# Patient Record
Sex: Female | Born: 1985 | Race: Black or African American | Hispanic: No | Marital: Single | Smoking: Never smoker
Health system: Southern US, Community
[De-identification: ages and names within clinical notes are randomized; demographics above are authoritative.]

## PROBLEM LIST (undated history)

## (undated) DIAGNOSIS — E079 Disorder of thyroid, unspecified: Secondary | ICD-10-CM

## (undated) DIAGNOSIS — I10 Essential (primary) hypertension: Secondary | ICD-10-CM

## (undated) DIAGNOSIS — N12 Tubulo-interstitial nephritis, not specified as acute or chronic: Secondary | ICD-10-CM

## (undated) DIAGNOSIS — D649 Anemia, unspecified: Secondary | ICD-10-CM

## (undated) HISTORY — DX: Tubulo-interstitial nephritis, not specified as acute or chronic: N12

## (undated) HISTORY — DX: Anemia, unspecified: D64.9

## (undated) HISTORY — PX: OTHER SURGICAL HISTORY: SHX169

---

## 2008-05-05 ENCOUNTER — Emergency Department: Payer: Self-pay | Admitting: Emergency Medicine

## 2008-10-07 ENCOUNTER — Encounter: Payer: Self-pay | Admitting: Maternal & Fetal Medicine

## 2009-03-11 ENCOUNTER — Observation Stay: Payer: Self-pay | Admitting: Unknown Physician Specialty

## 2009-03-14 ENCOUNTER — Inpatient Hospital Stay: Payer: Self-pay

## 2011-03-18 ENCOUNTER — Observation Stay: Payer: Self-pay | Admitting: Internal Medicine

## 2011-03-23 ENCOUNTER — Observation Stay: Payer: Self-pay

## 2011-04-01 ENCOUNTER — Observation Stay: Payer: Self-pay

## 2011-04-02 ENCOUNTER — Inpatient Hospital Stay: Payer: Self-pay

## 2014-10-19 ENCOUNTER — Encounter (HOSPITAL_COMMUNITY): Payer: Self-pay | Admitting: Emergency Medicine

## 2014-10-19 ENCOUNTER — Emergency Department (HOSPITAL_COMMUNITY)
Admission: EM | Admit: 2014-10-19 | Discharge: 2014-10-20 | Disposition: A | Discharge status: Home / Self Care | Payer: Self-pay | Attending: Emergency Medicine | Admitting: Emergency Medicine

## 2014-10-19 DIAGNOSIS — E079 Disorder of thyroid, unspecified: Secondary | ICD-10-CM | POA: Insufficient documentation

## 2014-10-19 DIAGNOSIS — I10 Essential (primary) hypertension: Secondary | ICD-10-CM | POA: Insufficient documentation

## 2014-10-19 DIAGNOSIS — R000 Tachycardia, unspecified: Secondary | ICD-10-CM | POA: Insufficient documentation

## 2014-10-19 DIAGNOSIS — R0602 Shortness of breath: Secondary | ICD-10-CM | POA: Insufficient documentation

## 2014-10-19 DIAGNOSIS — R55 Syncope and collapse: Secondary | ICD-10-CM | POA: Insufficient documentation

## 2014-10-19 DIAGNOSIS — Z79899 Other long term (current) drug therapy: Secondary | ICD-10-CM | POA: Insufficient documentation

## 2014-10-19 DIAGNOSIS — R0789 Other chest pain: Secondary | ICD-10-CM | POA: Insufficient documentation

## 2014-10-19 HISTORY — DX: Essential (primary) hypertension: I10

## 2014-10-19 HISTORY — DX: Disorder of thyroid, unspecified: E07.9

## 2014-10-19 LAB — CBC WITH DIFFERENTIAL/PLATELET
Basophils Absolute: 0 10*3/uL (ref 0.0–0.1)
Basophils Relative: 0 % (ref 0–1)
Eosinophils Absolute: 0 10*3/uL (ref 0.0–0.7)
Eosinophils Relative: 0 % (ref 0–5)
HEMATOCRIT: 39.7 % (ref 36.0–46.0)
Hemoglobin: 13.4 g/dL (ref 12.0–15.0)
Lymphocytes Relative: 51 % — ABNORMAL HIGH (ref 12–46)
Lymphs Abs: 3.5 10*3/uL (ref 0.7–4.0)
MCH: 28.9 pg (ref 26.0–34.0)
MCHC: 33.8 g/dL (ref 30.0–36.0)
MCV: 85.6 fL (ref 78.0–100.0)
Monocytes Absolute: 0.4 10*3/uL (ref 0.1–1.0)
Monocytes Relative: 6 % (ref 3–12)
NEUTROS ABS: 3 10*3/uL (ref 1.7–7.7)
Neutrophils Relative %: 43 % (ref 43–77)
Platelets: 276 10*3/uL (ref 150–400)
RBC: 4.64 MIL/uL (ref 3.87–5.11)
RDW: 13.4 % (ref 11.5–15.5)
WBC: 6.9 10*3/uL (ref 4.0–10.5)

## 2014-10-19 LAB — I-STAT TROPONIN, ED: Troponin i, poc: 0 ng/mL (ref 0.00–0.08)

## 2014-10-19 LAB — BASIC METABOLIC PANEL
ANION GAP: 7 (ref 5–15)
BUN: 9 mg/dL (ref 6–23)
CO2: 23 mmol/L (ref 19–32)
Calcium: 9 mg/dL (ref 8.4–10.5)
Chloride: 109 mmol/L (ref 96–112)
Creatinine, Ser: 0.7 mg/dL (ref 0.50–1.10)
GFR calc Af Amer: >90 mL/min (ref >90)
Glucose, Bld: 101 mg/dL — ABNORMAL HIGH (ref 70–99)
POTASSIUM: 3.6 mmol/L (ref 3.5–5.1)
SODIUM: 139 mmol/L (ref 135–145)

## 2014-10-19 MED ORDER — METOPROLOL TARTRATE 1 MG/ML IV SOLN
5.0000 mg | Freq: Once | INTRAVENOUS | Status: AC
  Administered 2014-10-19: 5 mg via INTRAVENOUS
  Filled 2014-10-19: qty 5

## 2014-10-19 MED ORDER — SODIUM CHLORIDE 0.9 % IV BOLUS (SEPSIS)
1000.0000 mL | Freq: Once | INTRAVENOUS | Status: AC
  Administered 2014-10-19: 1000 mL via INTRAVENOUS

## 2014-10-19 NOTE — ED Notes (Signed)
Pt arrives via EMS from home with c/o syncopal episode. Eye open, blinking, but unresponsive when found. HR 170s upon EMS arrival. NSR. Noncompliant with BP meds for a year. Chest tightness ongoing for months, feeling like her heart is racing ongoing for months. 20G LAC

## 2014-10-19 NOTE — ED Provider Notes (Signed)
CSN: [id number]     Arrival date & time 10/19/14  2238 History   First MD Initiated Contact with Patient 10/19/14 2258     Chief Complaint  Patient presents with  . Loss of Consciousness    Patient is a 29 y.o. female presenting with syncope. The history is provided by the patient. No language interpreter was used.  Loss of Consciousness Associated symptoms: shortness of breath   Associated symptoms: no diaphoresis, no fever, no nausea and no vomiting     This chart was scribed for Devoria Albe, MD by Andrew Au, ED Scribe. This patient was seen in room A11C/A11C and the patient's care was started at 4:57 AM.  HPI Comments:  Julia Woodard is a 29 y.o. female brought in by EMS who present to the Emergency Department complaining of LOC that occurred PTA. Pt reports the last she remembers before LOC is sitting on the couch when her vision became blurred. Pt states she has had worsening, intermittent chest tightness and SOB for the past couple of weeks. She reports chest tightness about twice a week that last for no longer than 15 minutes. She reports chest tightness improves with rest such as sitting. Nothing seems to make the pain start. Nothing makes the pain worse when she has it. Pt denies CP and trouble breathing at this time   She denies nausea, emesis, sweats, cough, fever, leg swelling. Pt reports hx of thyroid disease and is seen by Dr. Angelica Pou. Pt takes HCTZ prescribed by Dr. Dear and synthroid prescribed by Dr. Angelica Pou. Pt states she stop taking BP medication 6-7 months ago. Pt denies being a smoker. She reports having 2 glasses of wine tonight. Pt reports family hx of HTN, TIA's, stroke, congestive heart failure, COPD.   PCP Dr Dear Lauralee Evener OBG[facility] in [facility] Dr Shaune Pascal in [place]  Past Medical History  Diagnosis Date  . Hypertension   . Thyroid disease    History reviewed. No pertinent past surgical history. No family history on file. History  Substance Use  Topics  . Smoking status: Never Smoker   . Smokeless tobacco: Not on file  . Alcohol Use: Yes   OB History    No data available     Review of Systems  Constitutional: Negative for fever, chills and diaphoresis.  Respiratory: Positive for chest tightness and shortness of breath. Negative for cough.   Cardiovascular: Positive for syncope. Negative for leg swelling.  Gastrointestinal: Negative for nausea and vomiting.      Allergies  Review of patient's allergies indicates no known allergies.  Home Medications   Prior to Admission medications   Medication Sig Start Date End Date Taking? Authorizing Provider  levothyroxine (SYNTHROID, LEVOTHROID) 50 MCG tablet Take 50 mcg by mouth daily before breakfast.   Yes Historical Provider, MD   ED Triage Vitals  Enc Vitals Group     BP 10/19/14 2248 156/104 mmHg     Pulse Rate 10/19/14 2248 93     Resp 10/19/14 2248 16     Temp 10/19/14 2248 99.3 F (37.4 C)     Temp Source 10/19/14 2248 Oral     SpO2 10/19/14 2240 97 %     Weight --      Height --      Head Cir --      Peak Flow --      Pain Score 10/19/14 2243 0     Pain Loc --      Pain Edu? --  Excl. in GC? --    Vital signs normal except for hypertension   Physical Exam  Constitutional: She is oriented to person, place, and time. She appears well-developed and well-nourished.  Non-toxic appearance. She does not appear ill. No distress.  HENT:  Head: Normocephalic and atraumatic.  Right Ear: External ear normal.  Left Ear: External ear normal.  Nose: Nose normal. No mucosal edema or rhinorrhea.  Mouth/Throat: Oropharynx is clear and moist and mucous membranes are normal. No dental abscesses or uvula swelling.  Eyes: Conjunctivae and EOM are normal. Pupils are equal, round, and reactive to light.  Neck: Normal range of motion and full passive range of motion without pain. Neck supple.  Cardiovascular: Regular rhythm and normal heart sounds.  Tachycardia present.   Exam reveals no gallop and no friction rub.   No murmur heard. Pulmonary/Chest: Effort normal and breath sounds normal. No respiratory distress. She has no wheezes. She has no rhonchi. She has no rales. She exhibits no tenderness and no crepitus.  Abdominal: Soft. Normal appearance and bowel sounds are normal. She exhibits no distension. There is no tenderness. There is no rebound and no guarding.  Musculoskeletal: Normal range of motion. She exhibits no edema or tenderness.  Moves all extremities well.   Neurological: She is alert and oriented to person, place, and time. She has normal strength. No cranial nerve deficit.  Skin: Skin is warm, dry and intact. No rash noted. No erythema. No pallor.  Psychiatric: She has a normal mood and affect. Her speech is normal and behavior is normal. Her mood appears not anxious.  Nursing note and vitals reviewed.   ED Course  Procedures (including critical care time)  Medications  sodium chloride 0.9 % bolus 1,000 mL (0 mLs Intravenous Stopped 10/20/14 0335)  metoprolol (LOPRESSOR) injection 5 mg (5 mg Intravenous Given 10/19/14 2333)    DIAGNOSTIC STUDIES: Oxygen Saturation is 100% on RA, normal by my interpretation.    COORDINATION OF CARE: 11:34 PM- Pt advised of plan for treatment and pt agrees.   Patient was given Lopressor for her hypertension and tachycardia. She responded well.  Patient remained stable during her ED visit. Her ED course was delayed waiting for her second troponin to be drawn.  Patient second troponin is normal. She has atypical chest pain but she also has syncope that needs further evaluation. All of patient's physicians are in [place]. She's advised to let her PCP know tomorrow about her ED visit today to get referral to a cardiologist. She was advised to rest over the weekend.  Labs Review Results for orders placed or performed during the hospital encounter of 10/19/14  Basic metabolic panel  Result Value Ref Range    Sodium 139 135 - 145 mmol/L   Potassium 3.6 3.5 - 5.1 mmol/L   Chloride 109 96 - 112 mmol/L   CO2 23 19 - 32 mmol/L   Glucose, Bld 101 (H) 70 - 99 mg/dL   BUN 9 6 - 23 mg/dL   Creatinine, Ser 8.840.70 0.50 - 1.10 mg/dL   Calcium 9.0 8.4 - 16.610.5 mg/dL   GFR calc non Af Amer >90 >90 mL/min   GFR calc Af Amer >90 >90 mL/min   Anion gap 7 5 - 15  CBC with Differential/Platelet  Result Value Ref Range   WBC 6.9 4.0 - 10.5 K/uL   RBC 4.64 3.87 - 5.11 MIL/uL   Hemoglobin 13.4 12.0 - 15.0 g/dL   HCT 06.339.7 01.636.0 - 01.046.0 %  MCV 85.6 78.0 - 100.0 fL   MCH 28.9 26.0 - 34.0 pg   MCHC 33.8 30.0 - 36.0 g/dL   RDW 69.6 29.5 - 28.4 %   Platelets 276 150 - 400 K/uL   Neutrophils Relative % 43 43 - 77 %   Neutro Abs 3.0 1.7 - 7.7 K/uL   Lymphocytes Relative 51 (H) 12 - 46 %   Lymphs Abs 3.5 0.7 - 4.0 K/uL   Monocytes Relative 6 3 - 12 %   Monocytes Absolute 0.4 0.1 - 1.0 K/uL   Eosinophils Relative 0 0 - 5 %   Eosinophils Absolute 0.0 0.0 - 0.7 K/uL   Basophils Relative 0 0 - 1 %   Basophils Absolute 0.0 0.0 - 0.1 K/uL  Urinalysis, Routine w reflex microscopic  Result Value Ref Range   Color, Urine YELLOW YELLOW   APPearance CLOUDY (A) CLEAR   Specific Gravity, Urine 1.007 1.005 - 1.030   pH 5.5 5.0 - 8.0   Glucose, UA NEGATIVE NEGATIVE mg/dL   Hgb urine dipstick NEGATIVE NEGATIVE   Bilirubin Urine NEGATIVE NEGATIVE   Ketones, ur NEGATIVE NEGATIVE mg/dL   Protein, ur NEGATIVE NEGATIVE mg/dL   Urobilinogen, UA 0.2 0.0 - 1.0 mg/dL   Nitrite NEGATIVE NEGATIVE   Leukocytes, UA TRACE (A) NEGATIVE  Pregnancy, urine  Result Value Ref Range   Preg Test, Ur NEGATIVE NEGATIVE  Urine microscopic-add on  Result Value Ref Range   Squamous Epithelial / LPF FEW (A) RARE   WBC, UA 0-2 <3 WBC/hpf   Bacteria, UA RARE RARE   Urine-Other RARE YEAST   I-stat troponin, ED  Result Value Ref Range   Troponin i, poc 0.00 0.00 - 0.08 ng/mL   Comment 3          I-stat troponin, ED  Result Value Ref Range    Troponin i, poc 0.00 0.00 - 0.08 ng/mL   Comment 3           Laboratory interpretation all normal      Imaging Review No results found.   EKG Interpretation   Date/Time:  Saturday October 19 2014 22:45:29 EDT Ventricular Rate:  103 PR Interval:  159 QRS Duration: 84 QT Interval:  338 QTC Calculation: 442 R Axis:   46 Text Interpretation:  Age not entered, assumed to be  29 years old for  purpose of ECG interpretation Sinus tachycardia Probable anteroseptal  infarct, old Borderline T abnormalities, inferior leads Confirmed by COOK   MD, BRIAN ([id number]) on 10/19/2014 10:50:46 PM      MDM   Final diagnoses:  Atypical chest pain  Syncope, unspecified syncope type   Plan discharge  Devoria Albe, MD, FACEP     I personally performed the services described in this documentation, which was scribed in my presence. The recorded information has been reviewed and considered.  Devoria Albe, MD, Concha Pyo, MD 10/20/14 [phone]

## 2014-10-20 LAB — URINALYSIS, ROUTINE W REFLEX MICROSCOPIC
Bilirubin Urine: NEGATIVE
Glucose, UA: NEGATIVE mg/dL
HGB URINE DIPSTICK: NEGATIVE
Ketones, ur: NEGATIVE mg/dL
Nitrite: NEGATIVE
PH: 5.5 (ref 5.0–8.0)
Protein, ur: NEGATIVE mg/dL
SPECIFIC GRAVITY, URINE: 1.007 (ref 1.005–1.030)
Urobilinogen, UA: 0.2 mg/dL (ref 0.0–1.0)

## 2014-10-20 LAB — URINE MICROSCOPIC-ADD ON

## 2014-10-20 LAB — I-STAT TROPONIN, ED: Troponin i, poc: 0 ng/mL (ref 0.00–0.08)

## 2014-10-20 LAB — PREGNANCY, URINE: Preg Test, Ur: NEGATIVE

## 2014-10-20 NOTE — Discharge Instructions (Signed)
Go home and rest today. Call your doctors' office on Monday morning, March 28th to let them know about your ED visit. You should probably be evaluated by a cardiologist for your chest pains and passing out spells.   Syncope Syncope means a person passes out (faints). The person usually wakes up in less than 5 minutes. It is important to seek medical care for syncope. HOME CARE  Have someone stay with you until you feel normal.  Do not drive, use machines, or play sports until your doctor says it is okay.  Keep all doctor visits as told.  Lie down when you feel like you might pass out. Take deep breaths. Wait until you feel normal before standing up.  Drink enough fluids to keep your pee (urine) clear or pale yellow.  If you take blood pressure or heart medicine, get up slowly. Take several minutes to sit and then stand. GET HELP RIGHT AWAY IF:   You have a severe headache.  You have pain in the chest, belly (abdomen), or back.  You are bleeding from the mouth or butt (rectum).  You have black or tarry poop (stool).  You have an irregular or very fast heartbeat.  You have pain with breathing.  You keep passing out, or you have shaking (seizures) when you pass out.  You pass out when sitting or lying down.  You feel confused.  You have trouble walking.  You have severe weakness.  You have vision problems. If you fainted, call your local emergency services (911 in [place] Do not drive yourself to the hospital. MAKE SURE YOU:   Understand these instructions.  Will watch your condition.  Will get help right away if you are not doing well or get worse. Document Released: 12/29/2007 Document Revised: 01/11/2012 Document Reviewed: 09/10/2011 [facility] Patient Information 2015 [employer] [place]. This information is not intended to replace advice given to you by your health care provider. Make sure you discuss any questions you have with your health care provider.

## 2016-02-17 LAB — HM PAP SMEAR: HM PAP: NEGATIVE

## 2017-03-29 ENCOUNTER — Ambulatory Visit: Payer: Self-pay | Admitting: Obstetrics & Gynecology

## 2017-05-02 ENCOUNTER — Ambulatory Visit: Payer: Self-pay | Admitting: Obstetrics & Gynecology

## 2017-05-09 ENCOUNTER — Ambulatory Visit: Payer: Self-pay | Admitting: Obstetrics & Gynecology

## 2017-05-31 ENCOUNTER — Ambulatory Visit: Payer: Self-pay | Admitting: Obstetrics & Gynecology

## 2017-06-28 ENCOUNTER — Ambulatory Visit: Payer: Self-pay | Admitting: Obstetrics & Gynecology

## 2017-08-22 ENCOUNTER — Encounter: Payer: Self-pay | Admitting: Obstetrics & Gynecology

## 2017-08-22 ENCOUNTER — Ambulatory Visit: Payer: Self-pay | Admitting: Obstetrics & Gynecology

## 2017-10-13 ENCOUNTER — Encounter (HOSPITAL_COMMUNITY): Payer: Self-pay | Admitting: Emergency Medicine

## 2017-10-13 ENCOUNTER — Emergency Department (HOSPITAL_COMMUNITY): Payer: Self-pay

## 2017-10-13 ENCOUNTER — Other Ambulatory Visit: Payer: Self-pay

## 2017-10-13 DIAGNOSIS — I10 Essential (primary) hypertension: Secondary | ICD-10-CM | POA: Insufficient documentation

## 2017-10-13 DIAGNOSIS — R079 Chest pain, unspecified: Secondary | ICD-10-CM | POA: Insufficient documentation

## 2017-10-13 LAB — I-STAT TROPONIN, ED: TROPONIN I, POC: 0 ng/mL (ref 0.00–0.08)

## 2017-10-13 LAB — CBC
HCT: 40.6 % (ref 36.0–46.0)
Hemoglobin: 13.6 g/dL (ref 12.0–15.0)
MCH: 29 pg (ref 26.0–34.0)
MCHC: 33.5 g/dL (ref 30.0–36.0)
MCV: 86.6 fL (ref 78.0–100.0)
PLATELETS: 311 10*3/uL (ref 150–400)
RBC: 4.69 MIL/uL (ref 3.87–5.11)
RDW: 14.4 % (ref 11.5–15.5)
WBC: 6.2 10*3/uL (ref 4.0–10.5)

## 2017-10-13 LAB — BASIC METABOLIC PANEL
Anion gap: 13 (ref 5–15)
BUN: 15 mg/dL (ref 6–20)
CALCIUM: 9 mg/dL (ref 8.9–10.3)
CO2: 20 mmol/L — AB (ref 22–32)
CREATININE: 0.81 mg/dL (ref 0.44–1.00)
Chloride: 101 mmol/L (ref 101–111)
GFR calc non Af Amer: >60 mL/min (ref >60)
Glucose, Bld: 93 mg/dL (ref 65–99)
Potassium: 3.6 mmol/L (ref 3.5–5.1)
Sodium: 134 mmol/L — ABNORMAL LOW (ref 135–145)

## 2017-10-13 LAB — I-STAT BETA HCG BLOOD, ED (MC, WL, AP ONLY): I-stat hCG, quantitative: <5 m[IU]/mL (ref <5)

## 2017-10-13 NOTE — ED Triage Notes (Signed)
Pt reports chest pressure over the past couple of days, denies SOB, N/V/D, diaphoresis.  Reports she can tell her blood pressure in increased b/c she has been getting "that headache."

## 2017-10-14 ENCOUNTER — Emergency Department (HOSPITAL_COMMUNITY)
Admission: EM | Admit: 2017-10-14 | Discharge: 2017-10-14 | Disposition: A | Discharge status: Home / Self Care | Payer: Self-pay | Attending: Emergency Medicine | Admitting: Emergency Medicine

## 2017-10-14 DIAGNOSIS — R079 Chest pain, unspecified: Secondary | ICD-10-CM

## 2017-10-14 DIAGNOSIS — I10 Essential (primary) hypertension: Secondary | ICD-10-CM

## 2017-10-14 LAB — D-DIMER, QUANTITATIVE (NOT AT ARMC): D DIMER QUANT: 0.38 ug{FEU}/mL (ref 0.00–0.50)

## 2017-10-14 MED ORDER — HYDROCHLOROTHIAZIDE 25 MG PO TABS
25.0000 mg | ORAL_TABLET | Freq: Every day | ORAL | Status: DC
  Administered 2017-10-14: 25 mg via ORAL
  Filled 2017-10-14: qty 1

## 2017-10-14 MED ORDER — HYDROCHLOROTHIAZIDE 12.5 MG PO TABS: 25.0000 mg | ORAL_TABLET | Freq: Every day | ORAL | 1 refills | Status: AC

## 2017-10-14 NOTE — Discharge Instructions (Addendum)
You must follow-up with a doctor regarding your high blood pressure.  Please take the medication as directed.  Return if symptoms worsen.

## 2017-10-14 NOTE — ED Notes (Signed)
Lab called and will add on d-dimer.

## 2017-10-14 NOTE — ED Provider Notes (Signed)
[facility]   CSN: [id number] Arrival date & time: 10/13/17  2130     History   Chief Complaint Chief Complaint  Patient presents with  . Chest Pain  . Hypertension    HPI Julia Woodard is a 32 y.o. female.  Patient presents to the emergency department with chief complaint of chest pain.  She states that she has had chest pain for the past 2-3 days.  She denies any associated shortness of breath.  She states the pain radiates to her left arm.  Denies any diaphoresis or nausea.  She reports a history of hypertension, but has not been taking her blood pressure medication.  She states that her blood pressure has been running in the 170s-190s over low 100s.  She denies any history of PE or DVT.  Denies any fever, chills, cough.  Denies any injury.  She has not taken anything for symptoms.  There are no exacerbating factors.  The history is provided by the patient. No language interpreter was used.    Past Medical History:  Diagnosis Date  . Anemia   . Hypertension   . Pyelonephritis   . Thyroid disease     There are no active problems to display for this patient.   Past Surgical History:  Procedure Laterality Date  . nexplanon      OB History    Gravida  2   Para  1   Term  1   Preterm      AB  1   Living  1     SAB      TAB      Ectopic      Multiple      Live Births               Home Medications    Prior to Admission medications   Not on File    Family History No family history on file.  Social History Social History   Tobacco Use  . Smoking status: Never Smoker  Substance Use Topics  . Alcohol use: Yes  . Drug use: No     Allergies   Patient has no known allergies.   Review of Systems Review of Systems  All other systems reviewed and are negative.    Physical Exam Updated Vital Signs BP (!) 158/106   Pulse 85   Temp 98.7 F (37.1 C) (Oral)   Resp 19   SpO2  100%   Physical Exam  Constitutional: She is oriented to person, place, and time. She appears well-developed and well-nourished.  HENT:  Head: Normocephalic and atraumatic.  Eyes: Pupils are equal, round, and reactive to light. Conjunctivae and EOM are normal.  Neck: Normal range of motion. Neck supple.  Cardiovascular: Normal rate and regular rhythm. Exam reveals no gallop and no friction rub.  No murmur heard. Pulmonary/Chest: Effort normal and breath sounds normal. No respiratory distress. She has no wheezes. She has no rales. She exhibits no tenderness.  Abdominal: Soft. Bowel sounds are normal. She exhibits no distension and no mass. There is no tenderness. There is no rebound and no guarding.  Musculoskeletal: Normal range of motion. She exhibits no edema or tenderness.  Neurological: She is alert and oriented to person, place, and time.  Skin: Skin is warm and dry.  Psychiatric: She has a normal mood and affect. Her behavior is normal. Judgment and thought content normal.  Nursing note and vitals reviewed.  ED Treatments / Results  Labs (all labs ordered are listed, but only abnormal results are displayed) Labs Reviewed  BASIC METABOLIC PANEL - Abnormal; Notable for the following components:      Result Value   Sodium 134 (*)    CO2 20 (*)    All other components within normal limits  CBC  D-DIMER, QUANTITATIVE (NOT AT [facility])  I-STAT TROPONIN, ED  I-STAT BETA HCG BLOOD, ED ([facility], [facility], [facility] ONLY)    EKG  EKG Interpretation None       Radiology Dg Chest 2 View  Result Date: 10/13/2017 CLINICAL DATA:  Chest pressure past several days. EXAM: CHEST - 2 VIEW COMPARISON:  None. FINDINGS: The heart size and mediastinal contours are within normal limits. Both lungs are clear. Dextroconvex curvature of the spine at the thoracolumbar junction. IMPRESSION: No active cardiopulmonary disease. Electronically Signed   By: Tollie Eth M.D.   On: 10/13/2017 23:35     Procedures Procedures (including critical care time)  Medications Ordered in ED Medications  hydrochlorothiazide (HYDRODIURIL) tablet 25 mg (has no administration in time range)     Initial Impression / Assessment and Plan / ED Course  I have reviewed the triage vital signs and the nursing notes.  Pertinent labs & imaging results that were available during my care of the patient were reviewed by me and considered in my medical decision making (see chart for details).     Patient with hypertension and chest pain.  She seems very anxious.  She states that she has had the chest pain for the past 2-3 days.  Troponin is negative.  No acute ischemic changes on EKG.  Doubt ACS given age.  No history of PE or DVT in the patient.  No lower extremity swelling.  Will check d-dimer given tachycardia and chest pain.  However, I suspect that she is tachycardic because she is anxious.  Anticipate starting patient back on HCTZ, which she states that she was taking in the past, but discontinued herself.  D dimer is negative.  Will dc to home with HCTZ.  Final Clinical Impressions(s) / ED Diagnoses   Final diagnoses:  Chest pain, unspecified type  Hypertension, unspecified type    ED Discharge Orders        Ordered    hydrochlorothiazide (HYDRODIURIL) 12.5 MG tablet  Daily     10/14/17 0320       Roxy Horseman, PA-C 10/14/17 0323    Ward, Layla Maw, DO 10/14/17 0865

## 2018-01-02 ENCOUNTER — Ambulatory Visit: Payer: Self-pay | Admitting: Obstetrics & Gynecology

## 2018-01-09 ENCOUNTER — Ambulatory Visit: Payer: Self-pay | Admitting: Obstetrics & Gynecology

## 2018-02-06 ENCOUNTER — Ambulatory Visit: Payer: Self-pay | Admitting: Obstetrics & Gynecology

## 2018-07-10 ENCOUNTER — Ambulatory Visit: Payer: Self-pay | Admitting: Obstetrics & Gynecology

## 2018-08-14 ENCOUNTER — Ambulatory Visit: Payer: Self-pay | Admitting: Obstetrics & Gynecology

## 2018-12-05 IMAGING — DX DG CHEST 2V
2 series · 2 of 2 positions shown · non-contrast
Comparison: None.

CLINICAL DATA: Chest pressure past several days.

EXAM:
CHEST - 2 VIEW

[chest pa]
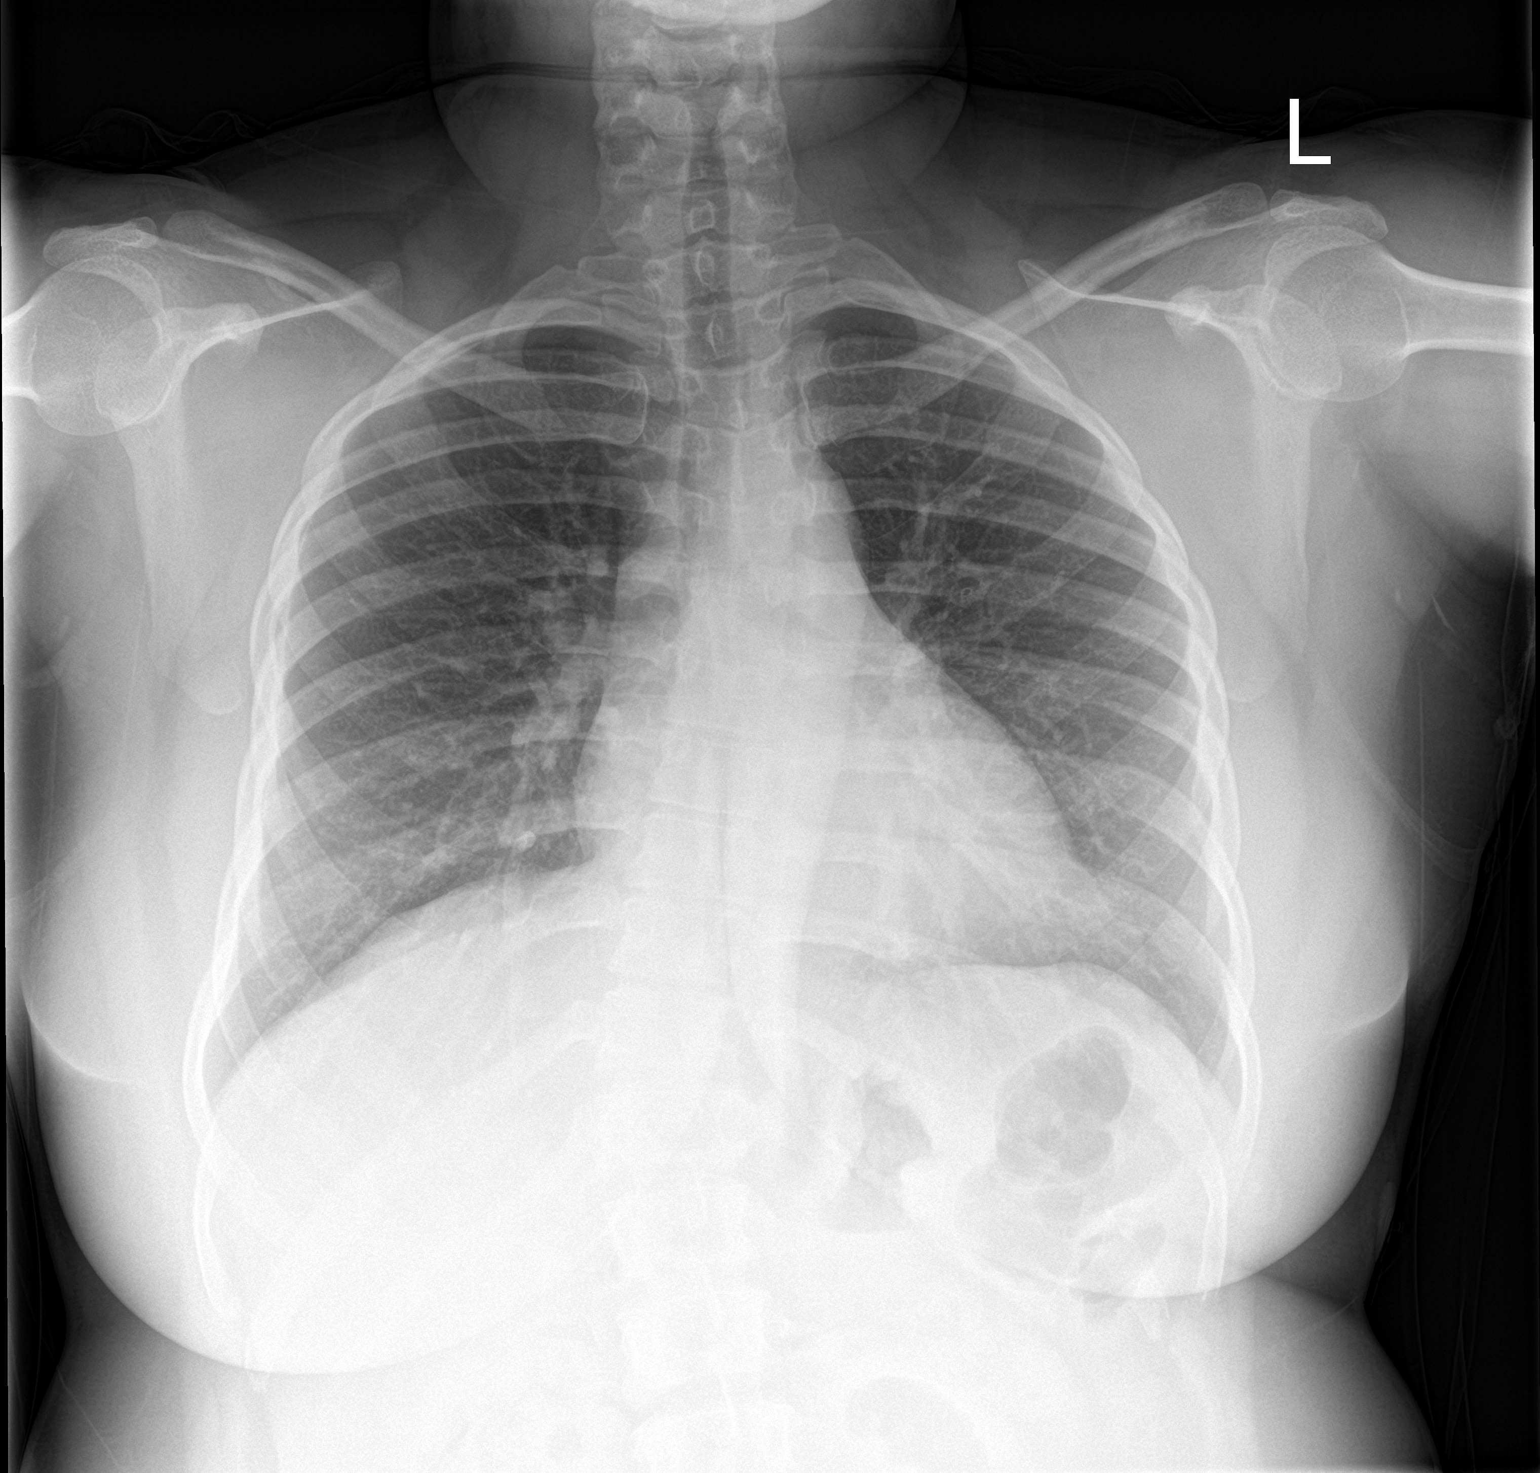

[chest lat]
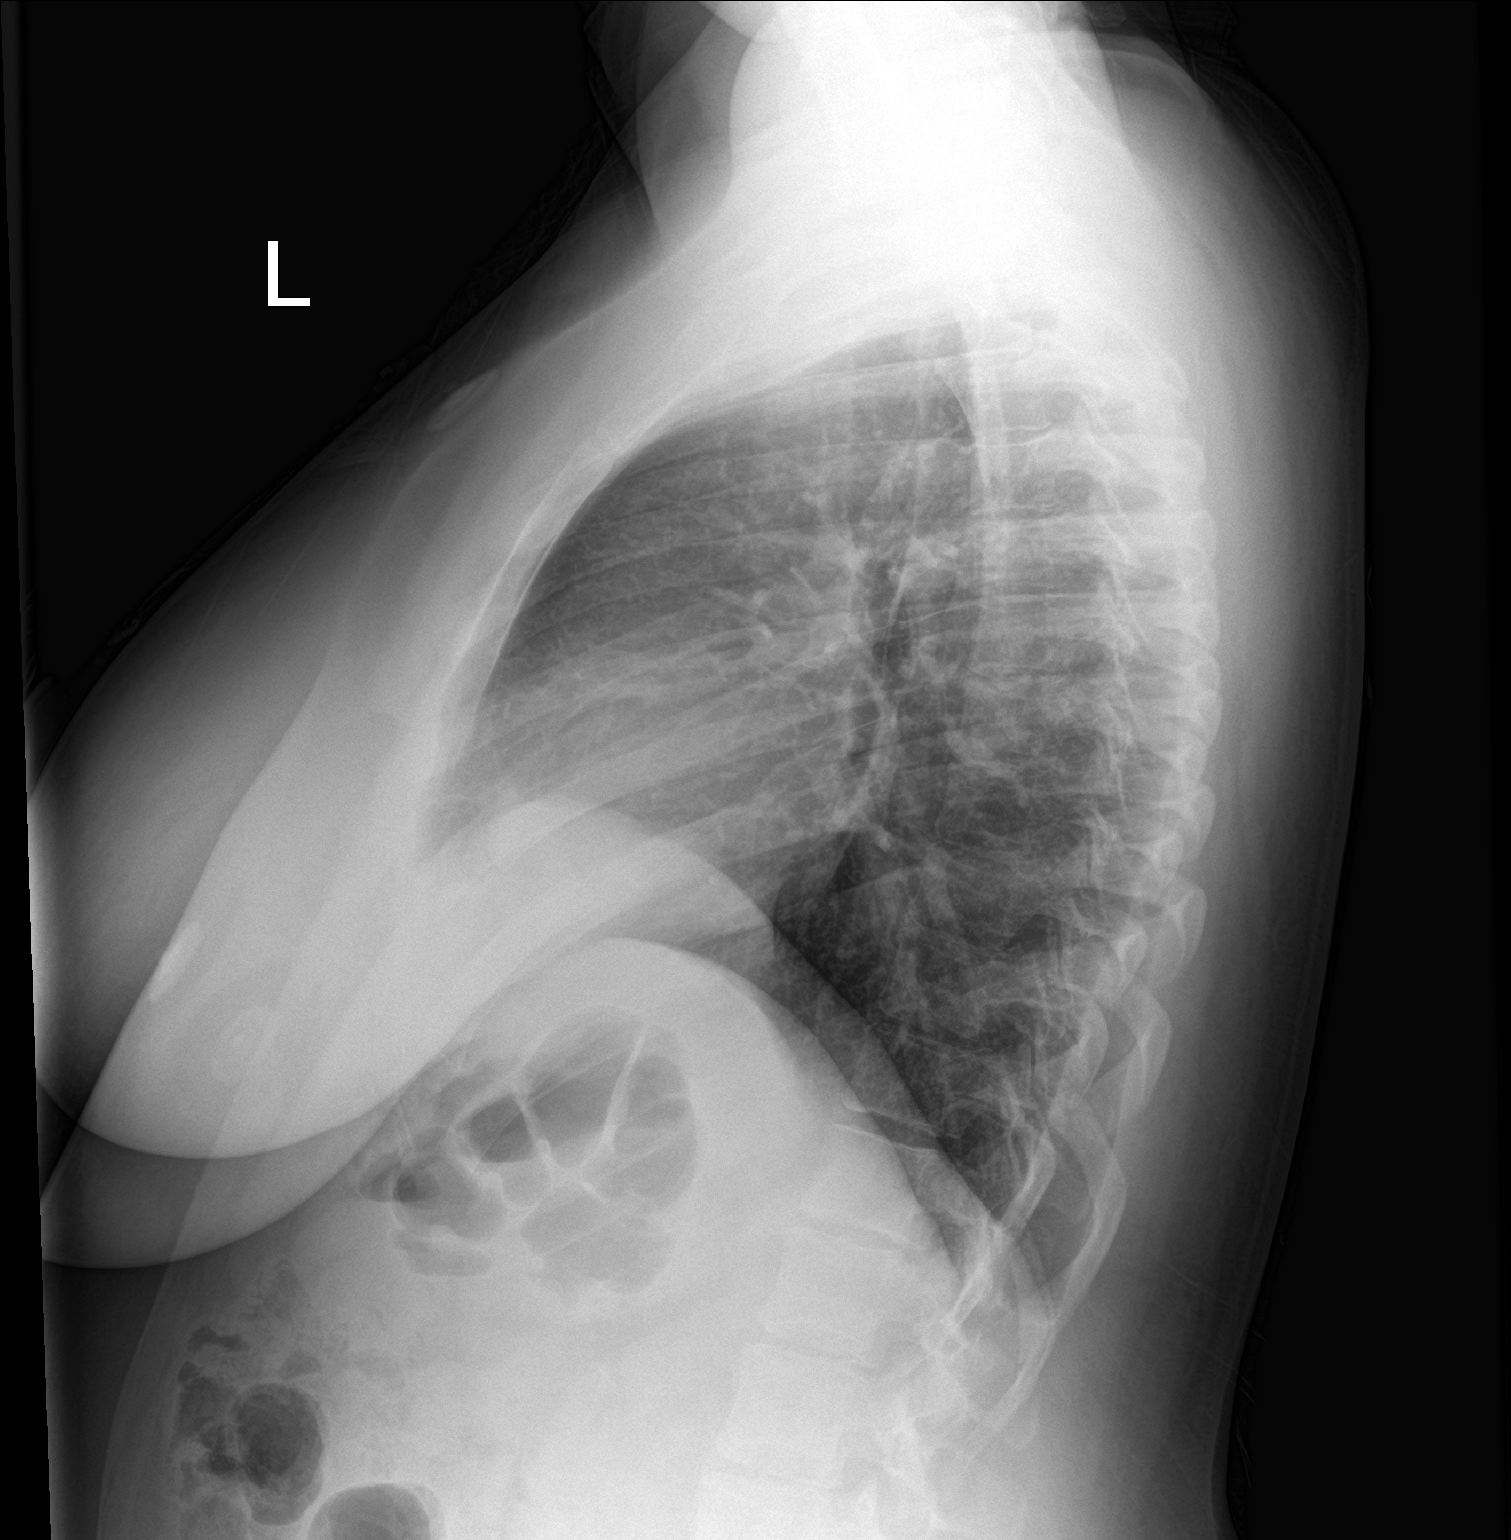

[2 of 2 positions shown; findings below may reference images not displayed]

FINDINGS: The heart size and mediastinal contours are within normal limits.
Both lungs are clear. Dextroconvex curvature of the spine at the
thoracolumbar junction.
IMPRESSION: No active cardiopulmonary disease.

## 2024-08-09 ENCOUNTER — Encounter: Payer: Self-pay | Admitting: Family Medicine
